# Patient Record
Sex: Female | Born: 1995 | Race: Black or African American | Hispanic: Yes | Marital: Single | Smoking: Never smoker
Health system: Southern US, Community
[De-identification: ages and names within clinical notes are randomized; demographics above are authoritative.]

## PROBLEM LIST (undated history)

## (undated) DIAGNOSIS — A749 Chlamydial infection, unspecified: Secondary | ICD-10-CM

## (undated) HISTORY — PX: WISDOM TOOTH EXTRACTION: SHX21

---

## 2017-01-09 ENCOUNTER — Encounter (HOSPITAL_COMMUNITY): Payer: Self-pay | Admitting: Emergency Medicine

## 2017-01-09 ENCOUNTER — Ambulatory Visit (HOSPITAL_COMMUNITY)
Admission: EM | Admit: 2017-01-09 | Discharge: 2017-01-09 | Disposition: A | Discharge status: Home / Self Care | Payer: PRIVATE HEALTH INSURANCE | Attending: Family Medicine | Admitting: Family Medicine

## 2017-01-09 DIAGNOSIS — N939 Abnormal uterine and vaginal bleeding, unspecified: Secondary | ICD-10-CM

## 2017-01-09 DIAGNOSIS — Z3202 Encounter for pregnancy test, result negative: Secondary | ICD-10-CM

## 2017-01-09 DIAGNOSIS — N926 Irregular menstruation, unspecified: Secondary | ICD-10-CM

## 2017-01-09 LAB — POCT URINALYSIS DIP (DEVICE)
Bilirubin Urine: NEGATIVE
GLUCOSE, UA: NEGATIVE mg/dL
HGB URINE DIPSTICK: NEGATIVE
Leukocytes, UA: NEGATIVE
Nitrite: NEGATIVE
PH: 7 (ref 5.0–8.0)
PROTEIN: NEGATIVE mg/dL
SPECIFIC GRAVITY, URINE: 1.02 (ref 1.005–1.030)
Urobilinogen, UA: 1 mg/dL (ref 0.0–1.0)

## 2017-01-09 NOTE — Discharge Instructions (Signed)
You have evidence of vaginal bleeding, that is most likely from the uterus but there was no definite source located on exam. You will need an ultrasound and OBGYN follow up. Please call the women's clinic as below to schedule follow up. If you continue to have bleeding, seek medical attention.   The urine pregnancy test was negative. Make sure to use condoms 100% of the time to avoid pregnancy.

## 2017-01-09 NOTE — ED Triage Notes (Signed)
Pt reports she had a normal period 4 weeks ago. She had sex three weeks ago. 10 days after her period, she bled again for a week. PT began vaginal bleeding again today. PT denies other discharge. PT reports left hip pain.

## 2017-01-09 NOTE — ED Provider Notes (Signed)
[facility]    CSN: [id number] Arrival date & time: 01/09/17  1926     History   Chief Complaint Chief Complaint  Patient presents with  . Vaginal Bleeding    HPI Cheryl Conner is a 21 y.o. female who presents for an episode of vaginal bleeding today.   She reports recently abnormal periods, occurring July 25, then again on Aug 9. Both lasted the regular 5 days, starting with heavy flow, becoming lighter. Previous to that she had regular monthly periods for the past year. She is not on contraception due to having irregular periods in the past on them. She has been sexually active with a single female partner with incomplete use of condoms. She has never been pregnant and does not desire pregnancy. About 1 hour prior to presenting in urgent care she wiped red after urinating, stating it was from the vagina. She denies recent sex. She also had a UTI (urinary frequency, dysuria) diagnosed by an OTC testing kit which resolved on its own several weeks ago. She took AZO OTC for this. She denies abdominal pain, N/V/D, fevers, vaginal discharge, and current urinary symptoms.    HPI  History reviewed. No pertinent past medical history.  There are no active problems to display for this patient.   History reviewed. No pertinent surgical history.  OB History    No data available       Home Medications    Prior to Admission medications   Not on File    Family History No family history on file.  Social History Social History  Substance Use Topics  . Smoking status: Never Smoker  . Smokeless tobacco: Never Used  . Alcohol use No     Allergies   Patient has no known allergies.   Review of Systems Review of Systems As above.   Physical Exam Triage Vital Signs ED Triage Vitals [01/09/17 1959]  Enc Vitals Group     BP 118/77     Pulse Rate 60     Resp 16     Temp 98.3 F (36.8 C)     Temp Source Oral     SpO2 100 %     Weight 150 lb (68 kg)     Height  _0  (1.6 m)     Head Circumference      Peak Flow      Pain Score 0     Pain Loc      Pain Edu?      Excl. in [place]?    No data found.  Physical Exam BP 118/77   Pulse 60   Temp 98.3 F (36.8 C) (Oral)   Resp 16   Ht _1  (1.6 m)   Wt 150 lb (68 kg)   LMP 12/28/2016   SpO2 100%   BMI 26.57 kg/m  Gen: Well-appearing 21 y.o.female in NAD  Pelvic: Normal external female genitalia without lesions. Vaginal mucosa and cervix normal without lesions. No discharge. There was very scant red blood in the vaginal vault without source from vaginal wall or from cervix with valsalva. No cervical motion tenderness.   Joelene Millin, RN was present throughout duration of exam.    UC Treatments / Results  Labs (all labs ordered are listed, but only abnormal results are displayed) Labs Reviewed  POCT URINALYSIS DIP (DEVICE) - Abnormal; Notable for the following:       Result Value   Ketones, ur TRACE (*)    All other components within  normal limits    EKG  EKG Interpretation None       Radiology No results found.  Procedures Procedures (including critical care time)  Medications Ordered in UC Medications - No data to display   Initial Impression / Assessment and Plan / UC Course  I have reviewed the triage vital signs and the nursing notes.  Pertinent labs & imaging results that were available during my care of the patient were reviewed by me and considered in my medical decision making (see chart for details).   Final Clinical Impressions(s) / UC Diagnoses   Final diagnoses:  Vaginal bleeding   21 y.o. female presenting for vaginal bleeding. Stable vital signs, nontoxic-appearing. Exam consistent with scant vaginal bleeding without definite source. UPT negative, UA negative.  - Refer to OB/GYN for further evaluation - Declines megace, provera, OCPs, etc.     Patrecia Pour, MD 01/09/17 2032

## 2017-01-10 ENCOUNTER — Telehealth: Payer: Self-pay | Admitting: Obstetrics and Gynecology

## 2017-01-10 NOTE — Telephone Encounter (Signed)
Patient called to make an appointment. When I gave her a date of 09/12, she said never mind then hung up the phone.

## 2017-01-11 ENCOUNTER — Encounter (HOSPITAL_COMMUNITY): Payer: Self-pay

## 2017-01-11 ENCOUNTER — Emergency Department (HOSPITAL_COMMUNITY)
Admission: EM | Admit: 2017-01-11 | Discharge: 2017-01-11 | Disposition: A | Discharge status: Home / Self Care | Payer: BLUE CROSS/BLUE SHIELD | Attending: Emergency Medicine | Admitting: Emergency Medicine

## 2017-01-11 DIAGNOSIS — B9689 Other specified bacterial agents as the cause of diseases classified elsewhere: Secondary | ICD-10-CM | POA: Insufficient documentation

## 2017-01-11 DIAGNOSIS — N939 Abnormal uterine and vaginal bleeding, unspecified: Secondary | ICD-10-CM | POA: Diagnosis not present

## 2017-01-11 DIAGNOSIS — N760 Acute vaginitis: Secondary | ICD-10-CM | POA: Insufficient documentation

## 2017-01-11 DIAGNOSIS — R1030 Lower abdominal pain, unspecified: Secondary | ICD-10-CM | POA: Diagnosis present

## 2017-01-11 LAB — CBC WITH DIFFERENTIAL/PLATELET
BASOS ABS: 0 10*3/uL (ref 0.0–0.1)
Basophils Relative: 0 %
EOS ABS: 0.1 10*3/uL (ref 0.0–0.7)
EOS PCT: 1 %
HCT: 36 % (ref 36.0–46.0)
Hemoglobin: 11.9 g/dL — ABNORMAL LOW (ref 12.0–15.0)
LYMPHS PCT: 49 %
Lymphs Abs: 2.5 10*3/uL (ref 0.7–4.0)
MCH: 28.5 pg (ref 26.0–34.0)
MCHC: 33.1 g/dL (ref 30.0–36.0)
MCV: 86.3 fL (ref 78.0–100.0)
MONO ABS: 0.5 10*3/uL (ref 0.1–1.0)
Monocytes Relative: 9 %
Neutro Abs: 2.1 10*3/uL (ref 1.7–7.7)
Neutrophils Relative %: 41 %
PLATELETS: 210 10*3/uL (ref 150–400)
RBC: 4.17 MIL/uL (ref 3.87–5.11)
RDW: 14.1 % (ref 11.5–15.5)
WBC: 5.2 10*3/uL (ref 4.0–10.5)

## 2017-01-11 LAB — PREGNANCY, URINE: Preg Test, Ur: NEGATIVE

## 2017-01-11 LAB — URINALYSIS, ROUTINE W REFLEX MICROSCOPIC
BILIRUBIN URINE: NEGATIVE
Bacteria, UA: NONE SEEN
GLUCOSE, UA: NEGATIVE mg/dL
KETONES UR: NEGATIVE mg/dL
LEUKOCYTES UA: NEGATIVE
NITRITE: NEGATIVE
PH: 6 (ref 5.0–8.0)
Protein, ur: NEGATIVE mg/dL
SPECIFIC GRAVITY, URINE: 1.019 (ref 1.005–1.030)

## 2017-01-11 LAB — COMPREHENSIVE METABOLIC PANEL
ALBUMIN: 3.9 g/dL (ref 3.5–5.0)
ALK PHOS: 41 U/L (ref 38–126)
ALT: 14 U/L (ref 14–54)
AST: 19 U/L (ref 15–41)
Anion gap: 5 (ref 5–15)
BILIRUBIN TOTAL: 0.3 mg/dL (ref 0.3–1.2)
BUN: 7 mg/dL (ref 6–20)
CALCIUM: 8.8 mg/dL — AB (ref 8.9–10.3)
CO2: 28 mmol/L (ref 22–32)
CREATININE: 0.8 mg/dL (ref 0.44–1.00)
Chloride: 108 mmol/L (ref 101–111)
GFR calc Af Amer: >60 mL/min (ref >60)
GFR calc non Af Amer: >60 mL/min (ref >60)
Glucose, Bld: 93 mg/dL (ref 65–99)
POTASSIUM: 3.8 mmol/L (ref 3.5–5.1)
Sodium: 141 mmol/L (ref 135–145)
TOTAL PROTEIN: 6.7 g/dL (ref 6.5–8.1)

## 2017-01-11 LAB — WET PREP, GENITAL
Sperm: NONE SEEN
Trich, Wet Prep: NONE SEEN
YEAST WET PREP: NONE SEEN

## 2017-01-11 MED ORDER — METRONIDAZOLE 500 MG PO TABS: 500.0000 mg | ORAL_TABLET | Freq: Two times a day (BID) | ORAL | 0 refills | Status: DC

## 2017-01-11 NOTE — ED Provider Notes (Signed)
[facility] Provider Note   CSN: [id number] Arrival date & time: 01/11/17  1240     History   Chief Complaint Chief Complaint  Patient presents with  . Vaginal Bleeding    HPI Cheryl Conner is a 21 y.o. female.  HPI  Patient presents to the for evaluation of abnormal vaginal bleeding and lower abdominal cramping. She states that about a month ago she took a Plan B pill. Since then she has had 3 menstrual cycles. She reports bleeding starts out like it only while she wipes but she has since had to use 2 pads every hour. She also reports some associated vaginal discharge. She is sexually active and states that the last time she had unprotected sexual intercourse was 6 days ago. She denies any birth control use, lightheadedness, dizziness, loss of consciousness,urinary symptoms, nausea or vomiting.  History reviewed. No pertinent past medical history.  There are no active problems to display for this patient.   History reviewed. No pertinent surgical history.  OB History    No data available       Home Medications    Prior to Admission medications   Medication Sig Start Date End Date Taking? Authorizing Provider  metroNIDAZOLE (FLAGYL) 500 MG tablet Take 1 tablet (500 mg total) by mouth 2 (two) times daily. 01/11/17   Dietrich Pates, PA-C    Family History History reviewed. No pertinent family history.  Social History Social History  Substance Use Topics  . Smoking status: Never Smoker  . Smokeless tobacco: Never Used  . Alcohol use No     Allergies   Patient has no known allergies.   Review of Systems Review of Systems  Constitutional: Negative for appetite change, chills and fever.  HENT: Negative for ear pain, rhinorrhea, sneezing and sore throat.   Eyes: Negative for photophobia and visual disturbance.  Respiratory: Negative for cough, chest tightness, shortness of breath and wheezing.   Cardiovascular: Negative for chest pain and palpitations.    Gastrointestinal: Positive for abdominal pain. Negative for blood in stool, constipation, diarrhea, nausea and vomiting.  Genitourinary: Positive for hematuria, vaginal bleeding and vaginal discharge. Negative for dysuria and urgency.  Musculoskeletal: Negative for myalgias.  Skin: Negative for rash.  Neurological: Negative for dizziness, weakness and light-headedness.     Physical Exam Updated Vital Signs BP (!) 104/53   Pulse 62   Temp 98 F (36.7 C) (Oral)   Resp 18   Ht 5\' 3"  (1.6 m)   Wt 68 kg (150 lb)   LMP 12/28/2016   SpO2 100%   BMI 26.57 kg/m   Physical Exam  Constitutional: She appears well-developed and well-nourished. No distress.  HENT:  Head: Normocephalic and atraumatic.  Nose: Nose normal.  Eyes: Conjunctivae and EOM are normal. Left eye exhibits no discharge. No scleral icterus.  Neck: Normal range of motion. Neck supple.  Cardiovascular: Normal rate, regular rhythm, normal heart sounds and intact distal pulses.  Exam reveals no gallop and no friction rub.   No murmur heard. Pulmonary/Chest: Effort normal and breath sounds normal. No respiratory distress.  Abdominal: Soft. Bowel sounds are normal. She exhibits no distension. There is tenderness (suprapubic and lower abdominal). There is no guarding.  Genitourinary: Cervix exhibits no motion tenderness and no discharge. Right adnexum displays no tenderness. Left adnexum displays no tenderness. There is bleeding in the vagina.  Genitourinary Comments: Chaperone present for the entirety of pelvic exam. Pelvic exam: normal external genitalia without evidence of trauma. VULVA: normal appearing  vulva with no masses, tenderness or lesion. VAGINA: dark red blood noted in vaginal vault.normal appearing vagina with normal color and discharge, no lesions. CERVIX: normal appearing cervix without lesions, cervical motion tenderness absent, cervical os closed with out purulent discharge;  No vaginal discharge. Wet prep  and DNA probe for chlamydia and GC obtained.   ADNEXA: normal adnexa in size, nontender and no masses UTERUS: uterus is normal size, shape, consistency and nontender.    Musculoskeletal: Normal range of motion. She exhibits no edema.  Neurological: She is alert. She exhibits normal muscle tone. Coordination normal.  Skin: Skin is warm and dry. No rash noted.  Psychiatric: She has a normal mood and affect.  Nursing note and vitals reviewed.    ED Treatments / Results  Labs (all labs ordered are listed, but only abnormal results are displayed) Labs Reviewed  WET PREP, GENITAL - Abnormal; Notable for the following:       Result Value   Clue Cells Wet Prep HPF POC PRESENT (*)    WBC, Wet Prep HPF POC FEW (*)    All other components within normal limits  URINALYSIS, ROUTINE W REFLEX MICROSCOPIC - Abnormal; Notable for the following:    Hgb urine dipstick MODERATE (*)    Squamous Epithelial / LPF 0-5 (*)    All other components within normal limits  COMPREHENSIVE METABOLIC PANEL - Abnormal; Notable for the following:    Calcium 8.8 (*)    All other components within normal limits  CBC WITH DIFFERENTIAL/PLATELET - Abnormal; Notable for the following:    Hemoglobin 11.9 (*)    All other components within normal limits  PREGNANCY, URINE  HIV ANTIBODY (ROUTINE TESTING)  RPR  GC/CHLAMYDIA PROBE AMP ([place]) NOT AT [facility]    EKG  EKG Interpretation None       Radiology No results found.  Procedures Procedures (including critical care time)  Medications Ordered in ED Medications - No data to display   Initial Impression / Assessment and Plan / ED Course  I have reviewed the triage vital signs and the nursing notes.  Pertinent labs & imaging results that were available during my care of the patient were reviewed by me and considered in my medical decision making (see chart for details).     Patient presents to ED for evaluation of abnormal vaginal bleeding and lower  abdominal cramping. She states that she took a Plan B pill about a month ago and since then she has had 3 menstrual cycles. She also reports vaginal discharge. She was seen at urgent care 2 days ago and was told she had negative pelvic exam. She was told to follow-up with women's clinic but was told to come here if pain got worse. Here she has some tenderness to palpation in the suprapubic area but no rebound or guarding present. She is afebrile with no history of fever. Pelvic exam reveals a closed cervix with some dark red blood in the vaginal vault. She has no cervical motion tenderness or adnexal tenderness. Pregnancy test was negative. Urinalysis with no evidence of UTI. CMP unremarkable. CBC with normal hemoglobin and hematocrit. Wet prep showed clue cells. GC and Chlamydia sent. HIV and RPR pending. No concerning vaginal discharge found in the exam. I informed patient has symptoms could be due to her taking the Plan B pill but there are other possibilities such as malignancy, cysts or other structural abnormalities. I will give her Flagyl for BV and encouraged her to follow-up with  women's clinic for further evaluation and imaging as needed. Patient appears stable for discharge at this time. Strict return precautions given.  Final Clinical Impressions(s) / ED Diagnoses   Final diagnoses:  BV (bacterial vaginosis)  Vaginal bleeding    New Prescriptions Discharge Medication List as of 01/11/2017  9:27 PM    START taking these medications   Details  metroNIDAZOLE (FLAGYL) 500 MG tablet Take 1 tablet (500 mg total) by mouth 2 (two) times daily., Starting Thu 01/11/2017, Print         Idelle Leech, [place], PA-C 01/11/17 2256    Nira Conn, MD 01/12/17 [phone]

## 2017-01-11 NOTE — ED Notes (Signed)
Pt called for v/s recheck, no response from lobby 

## 2017-01-11 NOTE — Discharge Instructions (Signed)
Please read attached information regarding your condition. Take Flagyl twice daily for 1 week. Follow-up with women's clinic listed below for further evaluation in the next 1-2 days. Return to ED for worsening abdominal pain, increased bleeding, lightheadedness, loss of consciousness, trouble breathing.

## 2017-01-11 NOTE — ED Notes (Signed)
Pt found in lobby after previously thought to have left AMA.

## 2017-01-11 NOTE — ED Notes (Signed)
Pt A/Ox4, ambulatory. Verbalizes understanding of d/c instructions, prescriptions, and follow up care. All belongings with pt upon departure

## 2017-01-11 NOTE — ED Triage Notes (Signed)
Patient reports she had a normal period July 20. Patient reports having unprotected sex on July 28 and took Plan B on July 29. Patient reports experiencing cloudy urine, urinary urgency and frequency, the week after Plan B, but patient states she managed it with OTC medication and "it just went away." Patient reported starting her period again on Aug 9. Patient states 2 days ago, she started experiencing vaginal bleeding again. Patient reports "when I go to the bathroom, it just fills the toilet with blood." Patient was seen at [facility] 2 days ago for the same. Patient states "I just keep bleeding so I came here. Patient denies abdominal pain/cramping. Patient denies other vaginal discharge other than the bleeding. Patient denies dysuria. Patient denies fever.

## 2017-01-12 LAB — RPR: RPR Ser Ql: NONREACTIVE

## 2017-01-12 LAB — GC/CHLAMYDIA PROBE AMP (~~LOC~~) NOT AT ARMC
CHLAMYDIA, DNA PROBE: NEGATIVE
Neisseria Gonorrhea: NEGATIVE

## 2017-01-12 LAB — HIV ANTIBODY (ROUTINE TESTING W REFLEX): HIV Screen 4th Generation wRfx: NONREACTIVE

## 2018-04-20 ENCOUNTER — Ambulatory Visit (HOSPITAL_COMMUNITY)
Admission: EM | Admit: 2018-04-20 | Discharge: 2018-04-20 | Disposition: A | Discharge status: Home / Self Care | Payer: BLUE CROSS/BLUE SHIELD | Attending: Internal Medicine | Admitting: Internal Medicine

## 2018-04-20 ENCOUNTER — Encounter (HOSPITAL_COMMUNITY): Payer: Self-pay

## 2018-04-20 DIAGNOSIS — L03019 Cellulitis of unspecified finger: Secondary | ICD-10-CM

## 2018-04-20 MED ORDER — CEPHALEXIN 500 MG PO CAPS: ORAL_CAPSULE | ORAL | 0 refills | Status: DC

## 2018-04-20 NOTE — Discharge Instructions (Signed)
You may soak your finger for a couple of day and 15-20 min at a time in Ebson salts and warm water.

## 2018-04-20 NOTE — ED Triage Notes (Signed)
Pt presents with possible infected fingernail after smashing the finger into a wall and lifting the nail off the nailbed.

## 2018-04-20 NOTE — ED Provider Notes (Signed)
[facility]    CSN: [id number] Arrival date & time: 04/20/18  1355     History   Chief Complaint Chief Complaint  Patient presents with  . Fingernail Issue    Right Pinky    HPI Cheryl Conner is a 22 y.o. female.   Who hit her R small finger on the wall accidentally 2 days ago. She was wearing fake nails and when she checked noticed the tip of her nail had detached. So she removed the fake nail. Since then noticed little pain and redness below the nail bed. Denies fever, chills, diaphoreses or aches.      History reviewed. No pertinent past medical history.  There are no active problems to display for this patient.   History reviewed. No pertinent surgical history.  OB History   None      Home Medications    Prior to Admission medications   Medication Sig Start Date End Date Taking? Authorizing Provider  cephALEXin (KEFLEX) 500 MG capsule 2 bid x 7 days 04/20/18   Rodriguez-Southworth, Nettie ElmSylvia, PA-C    Family History History reviewed. No pertinent family history.  Social History Social History   Tobacco Use  . Smoking status: Never Smoker  . Smokeless tobacco: Never Used  Substance Use Topics  . Alcohol use: No  . Drug use: Yes    Types: Marijuana     Allergies   Patient has no known allergies.   Review of Systems Review of Systems  Constitutional: Negative for chills, diaphoresis and fever.  Gastrointestinal: Negative for nausea.  Musculoskeletal: Negative for arthralgias and joint swelling.  Skin: Positive for color change. Negative for rash and wound.       See HPI  Neurological: Negative for weakness and numbness.     Physical Exam Triage Vital Signs ED Triage Vitals  Enc Vitals Group     BP 04/20/18 1511 (!) 110/56     Pulse Rate 04/20/18 1511 78     Resp 04/20/18 1511 20     Temp 04/20/18 1511 97.8 F (36.6 C)     Temp Source 04/20/18 1511 Oral     SpO2 04/20/18 1511 100 %     Weight --      Height --      Head  Circumference --      Peak Flow --      Pain Score 04/20/18 1510 5     Pain Loc --      Pain Edu? --      Excl. in [place]? --    No data found.  Updated Vital Signs BP (!) 110/56 (BP Location: Right Arm)   Pulse 78   Temp 97.8 F (36.6 C) (Oral)   Resp 20   LMP 04/07/2018   SpO2 100%   Visual Acuity Right Eye Distance:   Left Eye Distance:   Bilateral Distance:    Right Eye Near:   Left Eye Near:    Bilateral Near:     Physical Exam  Constitutional: She is oriented to person, place, and time. She appears well-developed and well-nourished. No distress.  HENT:  Head: Normocephalic.  Right Ear: External ear normal.  Left Ear: External ear normal.  Nose: Nose normal.  Eyes: Conjunctivae are normal. No scleral icterus.  Neck: Neck supple.  Pulmonary/Chest: Effort normal.  Musculoskeletal: She exhibits tenderness. She exhibits no edema or deformity.  R distal small finger with normal ROM, no ecchymosis or joint swelling.   Neurological: She is alert  and oriented to person, place, and time. No sensory deficit.  Skin: Skin is warm and dry. Capillary refill takes less than 2 seconds. She is not diaphoretic.  There is mild swelling and erythema on the distal dorsal R small finger surrounding the nail which is trimmed and does not look detached. The erythema is a little warm. There are no ecchymosis or open wounds.   Psychiatric: She has a normal mood and affect. Her behavior is normal. Judgment and thought content normal.  Nursing note and vitals reviewed.    UC Treatments / Results  Labs (all labs ordered are listed, but only abnormal results are displayed) Labs Reviewed - No data to display  EKG None  Radiology No results found.  Procedures   Medications Ordered in UC Medications - No data to display  Initial Impression / Assessment and Plan / UC Course  I have reviewed the triage vital signs and the nursing notes. I placed her on Keflex as noted x 7 days and  advised to soak finger in Ebson salts   Final Clinical Impressions(s) / UC Diagnoses   Final diagnoses:  Cellulitis of little finger     Discharge Instructions     You may soak your finger for a couple of day and 15-20 min at a time in Ebson salts and warm water.     ED Prescriptions    Medication Sig Dispense Auth. Provider   cephALEXin (KEFLEX) 500 MG capsule 2 bid x 7 days 20 capsule Rodriguez-Southworth, Nettie Elm, PA-C     Controlled Substance Prescriptions [place] Controlled Substance Registry consulted?    Garey Ham, PA-C 04/20/18 1535

## 2018-07-19 ENCOUNTER — Ambulatory Visit (HOSPITAL_COMMUNITY)
Admission: EM | Admit: 2018-07-19 | Discharge: 2018-07-19 | Disposition: A | Discharge status: Home / Self Care | Payer: BLUE CROSS/BLUE SHIELD

## 2018-07-23 ENCOUNTER — Encounter (HOSPITAL_COMMUNITY): Payer: Self-pay | Admitting: Emergency Medicine

## 2018-07-23 ENCOUNTER — Ambulatory Visit (HOSPITAL_COMMUNITY)
Admission: EM | Admit: 2018-07-23 | Discharge: 2018-07-23 | Disposition: A | Discharge status: Home / Self Care | Payer: BLUE CROSS/BLUE SHIELD | Attending: Family Medicine | Admitting: Family Medicine

## 2018-07-23 DIAGNOSIS — N760 Acute vaginitis: Secondary | ICD-10-CM | POA: Diagnosis not present

## 2018-07-23 MED ORDER — METRONIDAZOLE 500 MG PO TABS: 500.0000 mg | ORAL_TABLET | Freq: Two times a day (BID) | ORAL | 0 refills | Status: AC

## 2018-07-23 NOTE — ED Triage Notes (Signed)
Pt states for the last few months shes had some skin tags around her vaginal area. Pt c/o vaginal discharge on and off x1 week.

## 2018-07-23 NOTE — ED Provider Notes (Signed)
[facility]    CSN: [id number] Arrival date & time: 07/23/18  1109     History   Chief Complaint Chief Complaint  Patient presents with  . Vaginal Discharge  . Skin Problem    HPI Cheryl Conner is a 23 y.o. female.   Patient is a 23 year old female the presents today with approximately 3 days of vaginal discharge.  Symptoms been constant and remain the same.  She describes the discharge as white in color and milky.  Denies any specific foul odor.  Denies any associated bowel pain, back pain, pelvic pain, dysuria, hematuria.  Denies any urinary symptoms.  She is currently sexually active with one partner sometimes unprotected.  Last menstrual period was 06/28/2018.  She is not currently on any birth control.  She is also here with concerns of skin tags in the perineum.  These have been present for the past couple months.  She noticed them more after she had the vaginal area wax.  They do not cause her any irritation, itching or pain.  Reports that her sister has similar skin tags in the same area.  No fevers, chills, night sweats, drainage from the area.  ROS per HPI      History reviewed. No pertinent past medical history.  There are no active problems to display for this patient.   History reviewed. No pertinent surgical history.  OB History   No obstetric history on file.      Home Medications    Prior to Admission medications   Medication Sig Start Date End Date Taking? Authorizing Provider  metroNIDAZOLE (FLAGYL) 500 MG tablet Take 1 tablet (500 mg total) by mouth 2 (two) times daily. 07/23/18   Janace Aris, NP    Family History Family History  Problem Relation Age of Onset  . Cancer Mother   . Hypertension Mother     Social History Social History   Tobacco Use  . Smoking status: Never Smoker  . Smokeless tobacco: Never Used  Substance Use Topics  . Alcohol use: No  . Drug use: Yes    Types: Marijuana     Allergies   Patient has no  known allergies.   Review of Systems Review of Systems   Physical Exam Triage Vital Signs ED Triage Vitals  Enc Vitals Group     BP 07/23/18 1205 112/68     Pulse Rate 07/23/18 1205 63     Resp 07/23/18 1205 14     Temp 07/23/18 1205 98.1 F (36.7 C)     Temp src --      SpO2 07/23/18 1205 100 %     Weight --      Height --      Head Circumference --      Peak Flow --      Pain Score 07/23/18 1206 0     Pain Loc --      Pain Edu? --      Excl. in GC? --    No data found.  Updated Vital Signs BP 112/68   Pulse 63   Temp 98.1 F (36.7 C)   Resp 14   LMP 06/28/2018   SpO2 100%   Visual Acuity Right Eye Distance:   Left Eye Distance:   Bilateral Distance:    Right Eye Near:   Left Eye Near:    Bilateral Near:     Physical Exam Vitals signs and nursing note reviewed.  Constitutional:  General: She is not in acute distress.    Appearance: Normal appearance. She is not ill-appearing, toxic-appearing or diaphoretic.  HENT:     Head: Normocephalic and atraumatic.     Nose: Nose normal.     Mouth/Throat:     Pharynx: Oropharynx is clear.  Eyes:     Conjunctiva/sclera: Conjunctivae normal.  Neck:     Musculoskeletal: Normal range of motion.  Pulmonary:     Effort: Pulmonary effort is normal.  Abdominal:     Palpations: Abdomen is soft.     Tenderness: There is no abdominal tenderness.  Genitourinary:    Vagina: Vaginal discharge present.     Comments: External vaginal area with what appear to be skin tags  in the perineum. Non painful to touch.  No swelling or erythema to the labia.  Small amount of white discharge.    Speculum  exam deferred. No abd pain, pelvic pain, back pain Musculoskeletal: Normal range of motion.  Skin:    General: Skin is warm and dry.     Findings: No rash.  Neurological:     Mental Status: She is alert.  Psychiatric:        Mood and Affect: Mood normal.      UC Treatments / Results  Labs (all labs ordered are  listed, but only abnormal results are displayed) Labs Reviewed  CERVICOVAGINAL ANCILLARY ONLY    EKG None  Radiology No results found.  Procedures Procedures (including critical care time)  Medications Ordered in UC Medications - No data to display  Initial Impression / Assessment and Plan / UC Course  I have reviewed the triage vital signs and the nursing notes.  Pertinent labs & imaging results that were available during my care of the patient were reviewed by me and considered in my medical decision making (see chart for details).     Vaginitis. Based on history and symptoms we will go ahead and treat for bacterial vaginosis Swab sent for testing.  Will call with any positive results. Contact given for OB/GYN for further management to receive annual Pap smear and cervical cancer screening  Skin tags  This is a benign problem This is not causing her any discomfort at this time Recommend that if it starts to bother her based on the area then she can follow-up with dermatologist for possible removal Pt understanding and agreed to plan.  Final Clinical Impressions(s) / UC Diagnoses   Final diagnoses:  Acute vaginitis     Discharge Instructions     We will go ahead and treat you for bacterial vaginosis today.  Sending your swab for testing. We will call with any positive results.  The skin tags are not worrisome  If they become painful you may want to have them removed. You can see a dermatologist for this if  needed I recommend that you start seeing a GYN and getting regular pap smear based on your family hx of cervical cancer.  Follow up as needed for continued or worsening symptoms      ED Prescriptions    Medication Sig Dispense Auth. Provider   metroNIDAZOLE (FLAGYL) 500 MG tablet Take 1 tablet (500 mg total) by mouth 2 (two) times daily. 14 tablet Dahlia Byes A, NP     Controlled Substance Prescriptions [place] Controlled Substance Registry consulted? Not  Applicable   Janace Aris, NP 07/23/18 1325

## 2018-07-23 NOTE — Discharge Instructions (Signed)
We will go ahead and treat you for bacterial vaginosis today.  Sending your swab for testing. We will call with any positive results.  The skin tags are not worrisome  If they become painful you may want to have them removed. You can see a dermatologist for this if  needed I recommend that you start seeing a GYN and getting regular pap smear based on your family hx of cervical cancer.  Follow up as needed for continued or worsening symptoms

## 2018-07-25 LAB — CERVICOVAGINAL ANCILLARY ONLY
Bacterial vaginitis: POSITIVE — AB
CHLAMYDIA, DNA PROBE: NEGATIVE
Candida vaginitis: NEGATIVE
Neisseria Gonorrhea: NEGATIVE
Trichomonas: NEGATIVE

## 2018-10-16 ENCOUNTER — Encounter (HOSPITAL_COMMUNITY): Payer: Self-pay

## 2018-10-16 ENCOUNTER — Inpatient Hospital Stay (HOSPITAL_COMMUNITY)
Admission: AD | Admit: 2018-10-16 | Discharge: 2018-10-16 | Disposition: A | Discharge status: Home / Self Care | Payer: BLUE CROSS/BLUE SHIELD | Attending: Obstetrics and Gynecology | Admitting: Obstetrics and Gynecology

## 2018-10-16 ENCOUNTER — Other Ambulatory Visit: Payer: Self-pay

## 2018-10-16 ENCOUNTER — Inpatient Hospital Stay (HOSPITAL_COMMUNITY): Payer: BLUE CROSS/BLUE SHIELD

## 2018-10-16 DIAGNOSIS — Z6790 Unspecified blood type, Rh positive: Secondary | ICD-10-CM

## 2018-10-16 DIAGNOSIS — B373 Candidiasis of vulva and vagina: Secondary | ICD-10-CM | POA: Diagnosis not present

## 2018-10-16 DIAGNOSIS — O98811 Other maternal infectious and parasitic diseases complicating pregnancy, first trimester: Secondary | ICD-10-CM | POA: Diagnosis not present

## 2018-10-16 DIAGNOSIS — B3731 Acute candidiasis of vulva and vagina: Secondary | ICD-10-CM

## 2018-10-16 DIAGNOSIS — O26899 Other specified pregnancy related conditions, unspecified trimester: Secondary | ICD-10-CM

## 2018-10-16 DIAGNOSIS — R109 Unspecified abdominal pain: Secondary | ICD-10-CM

## 2018-10-16 DIAGNOSIS — O26891 Other specified pregnancy related conditions, first trimester: Secondary | ICD-10-CM | POA: Diagnosis present

## 2018-10-16 DIAGNOSIS — Z3A11 11 weeks gestation of pregnancy: Secondary | ICD-10-CM | POA: Insufficient documentation

## 2018-10-16 HISTORY — DX: Chlamydial infection, unspecified: A74.9

## 2018-10-16 LAB — CBC
HCT: 38.1 % (ref 36.0–46.0)
Hemoglobin: 12.8 g/dL (ref 12.0–15.0)
MCH: 29.1 pg (ref 26.0–34.0)
MCHC: 33.6 g/dL (ref 30.0–36.0)
MCV: 86.6 fL (ref 80.0–100.0)
Platelets: 246 10*3/uL (ref 150–400)
RBC: 4.4 MIL/uL (ref 3.87–5.11)
RDW: 12.5 % (ref 11.5–15.5)
WBC: 9.2 10*3/uL (ref 4.0–10.5)
nRBC: 0 % (ref 0.0–0.2)

## 2018-10-16 LAB — COMPREHENSIVE METABOLIC PANEL
ALT: 17 U/L (ref 0–44)
AST: 20 U/L (ref 15–41)
Albumin: 3.8 g/dL (ref 3.5–5.0)
Alkaline Phosphatase: 42 U/L (ref 38–126)
Anion gap: 9 (ref 5–15)
BUN: 7 mg/dL (ref 6–20)
CO2: 23 mmol/L (ref 22–32)
Calcium: 9.4 mg/dL (ref 8.9–10.3)
Chloride: 104 mmol/L (ref 98–111)
Creatinine, Ser: 0.71 mg/dL (ref 0.44–1.00)
GFR calc Af Amer: >60 mL/min (ref >60)
GFR calc non Af Amer: >60 mL/min (ref >60)
Glucose, Bld: 105 mg/dL — ABNORMAL HIGH (ref 70–99)
Potassium: 4.5 mmol/L (ref 3.5–5.1)
Sodium: 136 mmol/L (ref 135–145)
Total Bilirubin: 0.5 mg/dL (ref 0.3–1.2)
Total Protein: 7.3 g/dL (ref 6.5–8.1)

## 2018-10-16 LAB — URINALYSIS, ROUTINE W REFLEX MICROSCOPIC
Bilirubin Urine: NEGATIVE
Glucose, UA: NEGATIVE mg/dL
Hgb urine dipstick: NEGATIVE
Ketones, ur: 20 mg/dL — AB
Leukocytes,Ua: NEGATIVE
Nitrite: NEGATIVE
Protein, ur: NEGATIVE mg/dL
Specific Gravity, Urine: 1.017 (ref 1.005–1.030)
pH: 6 (ref 5.0–8.0)

## 2018-10-16 LAB — WET PREP, GENITAL
Clue Cells Wet Prep HPF POC: NONE SEEN
Sperm: NONE SEEN
Trich, Wet Prep: NONE SEEN

## 2018-10-16 MED ORDER — TERCONAZOLE 0.4 % VA CREA: 1.0000 | TOPICAL_CREAM | Freq: Every day | VAGINAL | 0 refills | Status: AC

## 2018-10-16 NOTE — MAU Note (Signed)
Abdominal pain that has gotten worse this week. 07/29/18, LMP. Rates pain 5/10. No bleeding.

## 2018-10-16 NOTE — Discharge Instructions (Signed)
Abdominal Pain During Pregnancy ° °Abdominal pain is common during pregnancy, and has many possible causes. Some causes are more serious than others, and sometimes the cause is not known. Abdominal pain can be a sign that labor is starting. It can also be caused by normal growth and stretching of muscles and ligaments during pregnancy. Always tell your health care provider if you have any abdominal pain. °Follow these instructions at home: °· Do not have sex or put anything in your vagina until your pain goes away completely. °· Get plenty of rest until your pain improves. °· Drink enough fluid to keep your urine pale yellow. °· Take over-the-counter and prescription medicines only as told by your health care provider. °· Keep all follow-up visits as told by your health care provider. This is important. °Contact a health care provider if: °· Your pain continues or gets worse after resting. °· You have lower abdominal pain that: °? Comes and goes at regular intervals. °? Spreads to your back. °? Is similar to menstrual cramps. °· You have pain or burning when you urinate. °Get help right away if: °· You have a fever or chills. °· You have vaginal bleeding. °· You are leaking fluid from your vagina. °· You are passing tissue from your vagina. °· You have vomiting or diarrhea that lasts for more than 24 hours. °· Your baby is moving less than usual. °· You feel very weak or faint. °· You have shortness of breath. °· You develop severe pain in your upper abdomen. °Summary °· Abdominal pain is common during pregnancy, and has many possible causes. °· If you experience abdominal pain during pregnancy, tell your health care provider right away. °· Follow your health care provider's home care instructions and keep all follow-up visits as directed. °This information is not intended to replace advice given to you by your health care provider. Make sure you discuss any questions you have with your health care  provider. °Document Released: 05/08/2005 Document Revised: 08/10/2016 Document Reviewed: 08/10/2016 °Elsevier Interactive Patient Education © 2019 [employer] ° °

## 2018-10-16 NOTE — MAU Provider Note (Signed)
History     CSN: [id number]  Arrival date and time: 10/16/18 1612   First Provider Initiated Contact with Patient 10/16/18 1640      Chief Complaint  Patient presents with  . Abdominal Pain   Ms. Cheryl Conner is a 23 y.o. G1P0 at 3579w2d who presents to MAU for stomach pain.  Onset: 1 week ago Location: LUQ Duration: 1 week Character: at first felt "like regular cramping," now is more intense, and "more long lasting today" - upon clarification, pt reports it used to come and go, but today she felt "more of a pressure when I was on my feet," sharp Aggravating/Associated: none/"an empty feeling after peeing" Relieving: none Treatment: none Severity: 5/10  Pt denies VB, vaginal discharge/odor/itching. Pt denies N/V, abdominal pain, constipation, diarrhea, or urinary problems. Pt denies fever, chills, fatigue, sweating or changes in appetite. Pt denies SOB or chest pain. Pt denies dizziness, HA, light-headedness, weakness.  Problems this pregnancy include: has not yet been seen, first appt this Friday. Allergies? NKDA Current medications/supplements? PNVs Prenatal care provider? [facility]   OB History    Gravida  1   Para      Term      Preterm      AB      Living        SAB      TAB      Ectopic      Multiple      Live Births              Past Medical History:  Diagnosis Date  . Chlamydia     Past Surgical History:  Procedure Laterality Date  . WISDOM TOOTH EXTRACTION      Family History  Problem Relation Age of Onset  . Cancer Mother   . Hypertension Mother     Social History   Tobacco Use  . Smoking status: Never Smoker  . Smokeless tobacco: Never Used  Substance Use Topics  . Alcohol use: Not Currently  . Drug use: Yes    Types: Marijuana    Allergies: No Known Allergies  Medications Prior to Admission  Medication Sig Dispense Refill Last Dose  . metroNIDAZOLE (FLAGYL) 500 MG tablet Take 1 tablet (500 mg total) by mouth 2  (two) times daily. 14 tablet 0     Review of Systems  Constitutional: Negative for chills, diaphoresis, fatigue and fever.  Respiratory: Negative for shortness of breath.   Cardiovascular: Negative for chest pain.  Gastrointestinal: Positive for abdominal pain. Negative for constipation, diarrhea, nausea and vomiting.  Genitourinary: Negative for dysuria, flank pain, frequency, pelvic pain, urgency, vaginal bleeding and vaginal discharge.  Neurological: Negative for dizziness, weakness, light-headedness and headaches.   Physical Exam   Blood pressure 115/70, pulse 89, temperature 98.6 F (37 C), temperature source Oral, resp. rate 16, last menstrual period 07/29/2018, SpO2 100 %.  Patient Vitals for the past 24 hrs:  BP Temp Temp src Pulse Resp SpO2  10/16/18 1632 115/70 - - 89 - 100 %  10/16/18 1630 - 98.6 F (37 C) Oral - 16 -   Physical Exam  Constitutional: She is oriented to person, place, and time. She appears well-developed and well-nourished. No distress.  HENT:  Head: Normocephalic and atraumatic.  Respiratory: Effort normal.  GI: Soft. She exhibits no distension and no mass. There is no abdominal tenderness. There is no rebound and no guarding.  Genitourinary: There is no rash, tenderness or lesion on the right labia. There  is no rash, tenderness or lesion on the left labia. Uterus is not fixed and not tender. Cervix exhibits no motion tenderness, no discharge and no friability. Right adnexum displays no mass, no tenderness and no fullness. Left adnexum displays no mass, no tenderness and no fullness.    Vaginal discharge (normal, white) present.     No vaginal tenderness or bleeding.  No tenderness or bleeding in the vagina.  Neurological: She is alert and oriented to person, place, and time.  Skin: Skin is warm and dry. She is not diaphoretic.  Psychiatric: She has a normal mood and affect. Her behavior is normal. Judgment and thought content normal.   Results for  orders placed or performed during the hospital encounter of 10/16/18 (from the past 24 hour(s))  Urinalysis, Routine w reflex microscopic     Status: Abnormal   Collection Time: 10/16/18  4:28 PM  Result Value Ref Range   Color, Urine YELLOW YELLOW   APPearance HAZY (A) CLEAR   Specific Gravity, Urine 1.017 1.005 - 1.030   pH 6.0 5.0 - 8.0   Glucose, UA NEGATIVE NEGATIVE mg/dL   Hgb urine dipstick NEGATIVE NEGATIVE   Bilirubin Urine NEGATIVE NEGATIVE   Ketones, ur 20 (A) NEGATIVE mg/dL   Protein, ur NEGATIVE NEGATIVE mg/dL   Nitrite NEGATIVE NEGATIVE   Leukocytes,Ua NEGATIVE NEGATIVE  CBC     Status: None   Collection Time: 10/16/18  4:53 PM  Result Value Ref Range   WBC 9.2 4.0 - 10.5 K/uL   RBC 4.40 3.87 - 5.11 MIL/uL   Hemoglobin 12.8 12.0 - 15.0 g/dL   HCT 16.1 09.6 - 04.5 %   MCV 86.6 80.0 - 100.0 fL   MCH 29.1 26.0 - 34.0 pg   MCHC 33.6 30.0 - 36.0 g/dL   RDW 40.9 81.1 - 91.4 %   Platelets 246 150 - 400 K/uL   nRBC 0.0 0.0 - 0.2 %  Wet prep, genital     Status: Abnormal   Collection Time: 10/16/18  4:53 PM  Result Value Ref Range   Yeast Wet Prep HPF POC PRESENT (A) NONE SEEN   Trich, Wet Prep NONE SEEN NONE SEEN   Clue Cells Wet Prep HPF POC NONE SEEN NONE SEEN   WBC, Wet Prep HPF POC MANY (A) NONE SEEN   Sperm NONE SEEN   Comprehensive metabolic panel     Status: Abnormal   Collection Time: 10/16/18  4:53 PM  Result Value Ref Range   Sodium 136 135 - 145 mmol/L   Potassium 4.5 3.5 - 5.1 mmol/L   Chloride 104 98 - 111 mmol/L   CO2 23 22 - 32 mmol/L   Glucose, Bld 105 (H) 70 - 99 mg/dL   BUN 7 6 - 20 mg/dL   Creatinine, Ser 7.82 0.44 - 1.00 mg/dL   Calcium 9.4 8.9 - 95.6 mg/dL   Total Protein 7.3 6.5 - 8.1 g/dL   Albumin 3.8 3.5 - 5.0 g/dL   AST 20 15 - 41 U/L   ALT 17 0 - 44 U/L   Alkaline Phosphatase 42 38 - 126 U/L   Total Bilirubin 0.5 0.3 - 1.2 mg/dL   GFR calc non Af Amer >60 >60 mL/min   GFR calc Af Amer >60 >60 mL/min   Anion gap 9 5 - 15  ABO/Rh      Status: None   Collection Time: 10/16/18  4:55 PM  Result Value Ref Range   ABO/RH(D)  O POS Performed at [facility], [address], [place], [place] [address]    US Ob Comp Less 14 Wks  Result Date: 10/16/2018 CLINICAL DATA:  Pregnant patient with pelvic pain. EXAM: OBSTETRIC <14 WK ULTRASOUND TECHNIQUE: Transabdominal ultrasound was performed for evaluation of the gestation as well as the maternal uterus and adnexal regions. COMPARISON:  None. FINDINGS: Intrauterine gestational sac: Single. Yolk sac:  Visualized. Embryo:  Visualized. Cardiac Activity: Detected. Heart Rate: 157 bpm CRL:   49.7 mm   11 w 5 d                  [place] EDC: 05/02/2019. Subchorionic hemorrhage: Small to moderate subchorionic hemorrhage is identified. Maternal uterus/adnexae: No acute finding. Corpus luteum cyst on the left is noted. IMPRESSION: Single living intrauterine pregnancy with a small to moderate subchorionic hemorrhage identified. Electronically Signed   By: Drusilla Kanner M.D.   On: 10/16/2018 18:21    MAU Course  Procedures  MDM -LUQ pain per pt report, without evidence of pain on exam -UA: hazy/20Ketones, otherwise WNL -CBC: WNL -[place]: single IUP, FHB 157, [redacted]w[redacted]d, small-mod subchorionic hemorrhage, left CL -CMP: WNL -ABO: O Positive -WetPrep: +yeast, + WBCs, otherwise WNL -GC/CT collected -pt discharged to home in stable condition  Orders Placed This Encounter  Procedures  . Wet prep, genital    Standing Status:   Standing    Number of Occurrences:   1  . US OB Comp Less 14 Wks    Standing Status:   Standing    Number of Occurrences:   1    Order Specific Question:   Symptom/Reason for Exam    Answer:   Abdominal pain in pregnancy [191478]  . Urinalysis, Routine w reflex microscopic    Standing Status:   Standing    Number of Occurrences:   1  . CBC    Standing Status:   Standing    Number of Occurrences:   1  . Comprehensive metabolic panel    PLEASE DRAW ABO     Standing Status:   Standing    Number of Occurrences:   1  . ABO/Rh    Standing Status:   Standing    Number of Occurrences:   1  . Discharge patient    Order Specific Question:   Discharge disposition    Answer:   01-Home or Self Care [1]    Order Specific Question:   Discharge patient date    Answer:   10/16/2018   Meds ordered this encounter  Medications  . terconazole (TERAZOL 7) 0.4 % vaginal cream    Sig: Place 1 applicator vaginally at bedtime for 7 days.    Dispense:  45 g    Refill:  0    Order Specific Question:   Supervising Provider    Answer:   Roberts Bing [[id number]]   Assessment and Plan   1. Abdominal pain in pregnancy   2. Vaginal yeast infection   3. [redacted] weeks gestation of pregnancy   4. Blood type, Rh positive    Allergies as of 10/16/2018   No Known Allergies     Medication List    TAKE these medications   metroNIDAZOLE 500 MG tablet Commonly known as:  FLAGYL Take 1 tablet (500 mg total) by mouth 2 (two) times daily.   terconazole 0.4 % vaginal cream Commonly known as:  TERAZOL 7 Place 1 applicator vaginally at bedtime for 7 days.      -  will call with culture results, if positive -discussed normal aches and pains of pregnancy and nonpharmacologic treatment -strict abdominal pain/vaginal bleeding/return MAU precautions discussed -pt questions asked and answered -pt discharged to home in stable condition  Odie Sera Nugent 10/16/2018, 6:45 PM

## 2018-10-17 LAB — GC/CHLAMYDIA PROBE AMP (~~LOC~~) NOT AT ARMC
Chlamydia: NEGATIVE
Neisseria Gonorrhea: NEGATIVE

## 2018-10-17 LAB — ABO/RH: ABO/RH(D): O POS

## 2018-10-18 ENCOUNTER — Other Ambulatory Visit (HOSPITAL_COMMUNITY): Payer: Self-pay | Admitting: Family

## 2018-10-18 DIAGNOSIS — Z3682 Encounter for antenatal screening for nuchal translucency: Secondary | ICD-10-CM

## 2018-10-18 DIAGNOSIS — Z3A13 13 weeks gestation of pregnancy: Secondary | ICD-10-CM

## 2018-10-28 ENCOUNTER — Other Ambulatory Visit: Payer: PRIVATE HEALTH INSURANCE

## 2018-10-28 ENCOUNTER — Ambulatory Visit (HOSPITAL_COMMUNITY): Payer: BLUE CROSS/BLUE SHIELD | Admitting: *Deleted

## 2018-10-28 ENCOUNTER — Ambulatory Visit (HOSPITAL_COMMUNITY)
Admission: RE | Admit: 2018-10-28 | Discharge: 2018-10-28 | Disposition: A | Discharge status: Home / Self Care | Payer: BLUE CROSS/BLUE SHIELD | Source: Ambulatory Visit | Attending: Obstetrics and Gynecology | Admitting: Obstetrics and Gynecology

## 2018-10-28 ENCOUNTER — Other Ambulatory Visit: Payer: Self-pay

## 2018-10-28 ENCOUNTER — Ambulatory Visit (HOSPITAL_COMMUNITY): Payer: BLUE CROSS/BLUE SHIELD

## 2018-10-28 ENCOUNTER — Encounter (HOSPITAL_COMMUNITY): Payer: Self-pay

## 2018-10-28 VITALS — BP 109/61 | HR 71 | Temp 98.2°F | Wt 150.4 lb

## 2018-10-28 DIAGNOSIS — Z3682 Encounter for antenatal screening for nuchal translucency: Secondary | ICD-10-CM

## 2018-10-28 DIAGNOSIS — Z3A13 13 weeks gestation of pregnancy: Secondary | ICD-10-CM | POA: Diagnosis present

## 2020-08-26 IMAGING — US OBSTETRIC <14 WK ULTRASOUND
1 series · 15 of 28 positions shown · non-contrast
Comparison: None.

CLINICAL DATA: Pregnant patient with pelvic pain.

EXAM:
OBSTETRIC <14 WK ULTRASOUND
TECHNIQUE: Transabdominal ultrasound was performed for evaluation of the
gestation as well as the maternal uterus and adnexal regions.

[Series 1: obstetric <14 wk ultrasound · 15 of 38 slices shown]
[im 1/38]
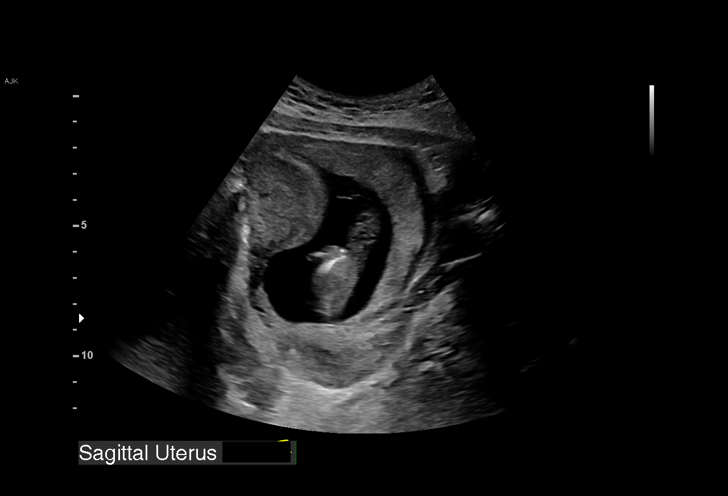
[im 3/38]
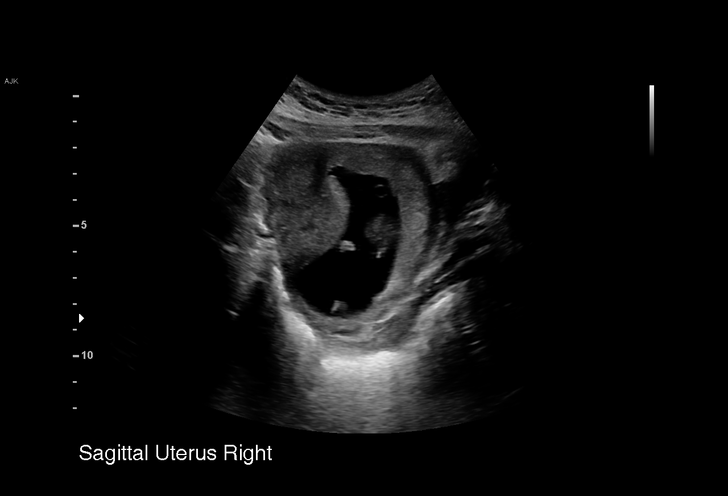
[im 6/38]
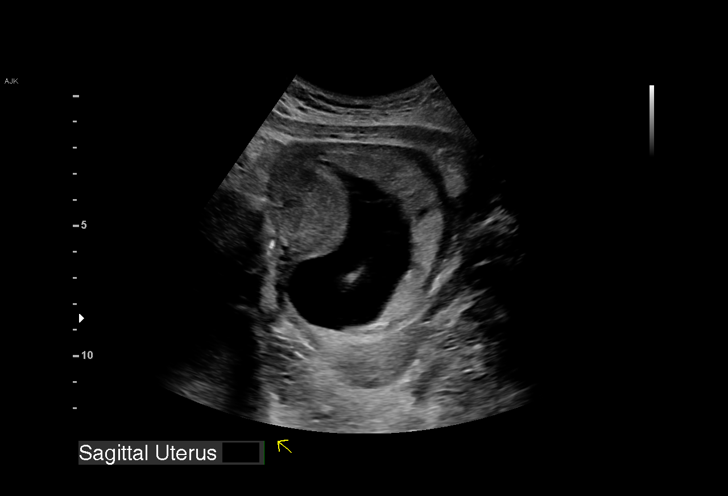
[im 9/38]
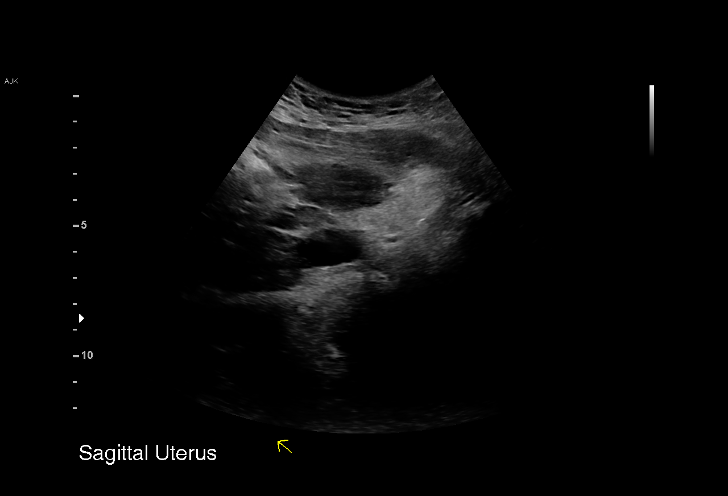
[im 11/38]
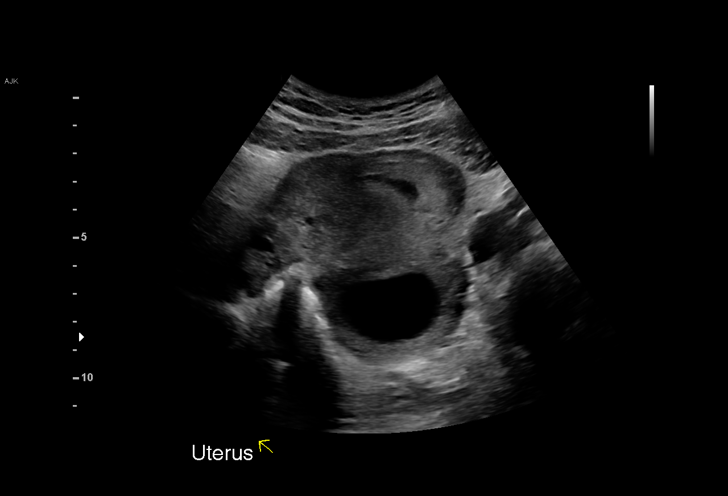
[im 14/38]
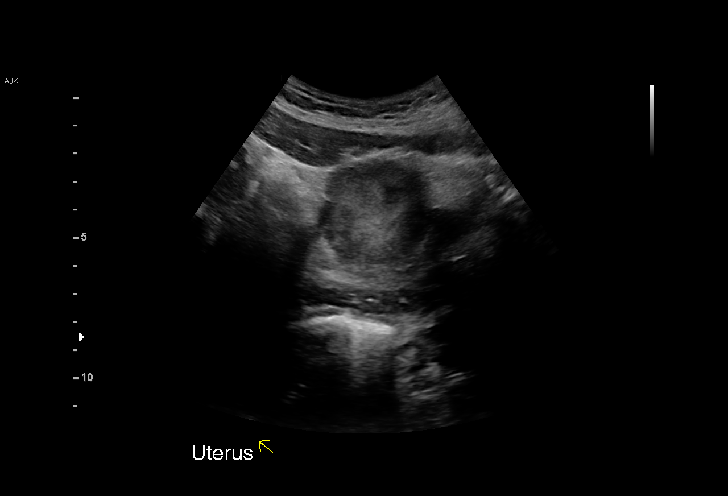
[im 17/38]
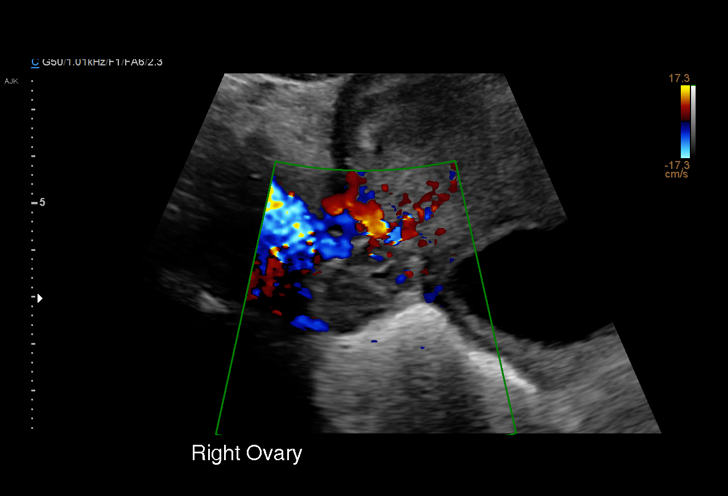
[im 20/38]
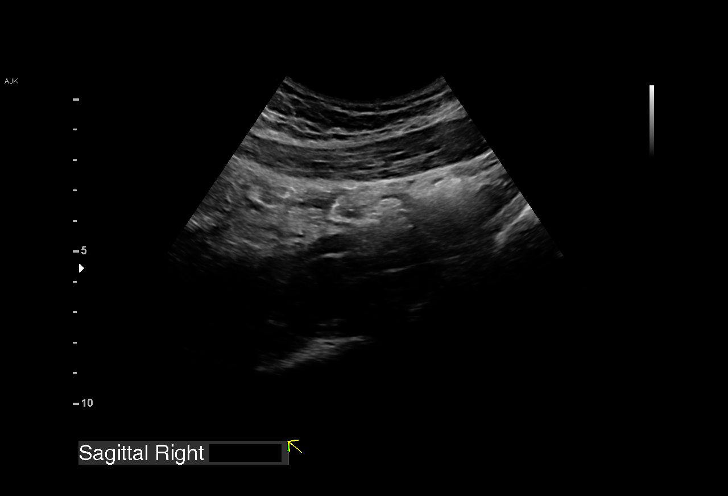
[im 21/38]
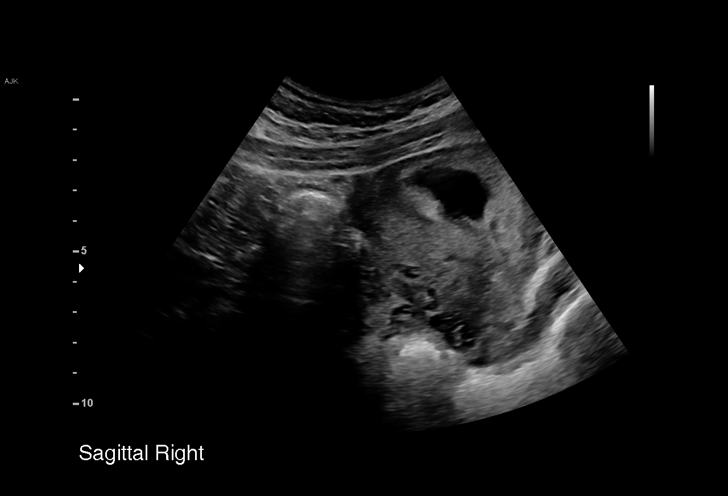
[im 24/38]
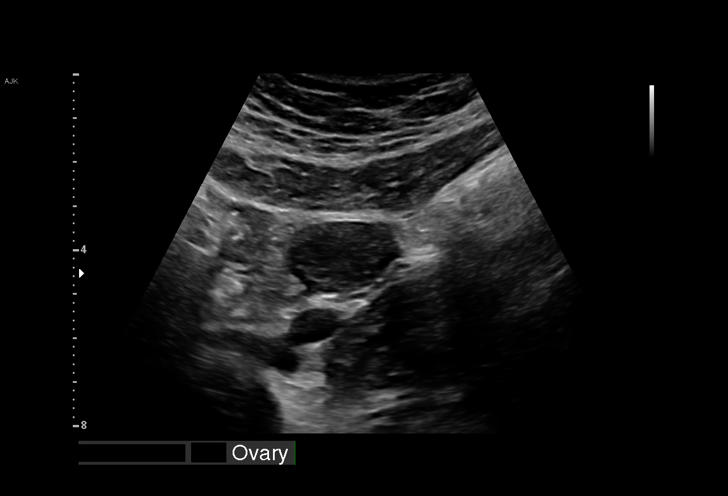
[im 27/38]
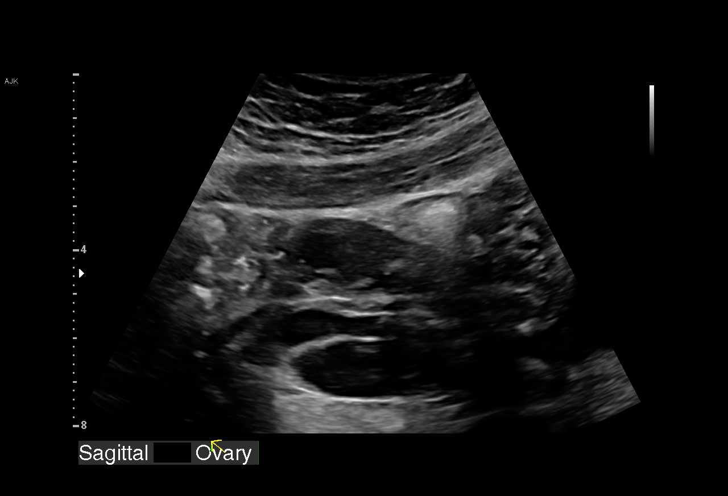
[im 29/38]
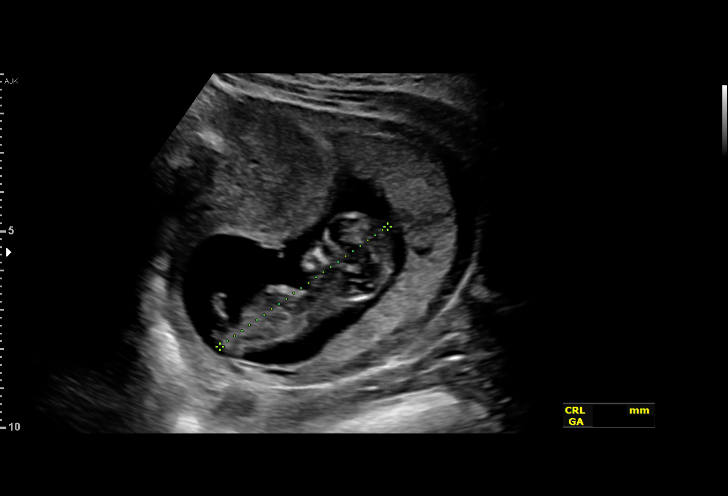
[im 32/38]
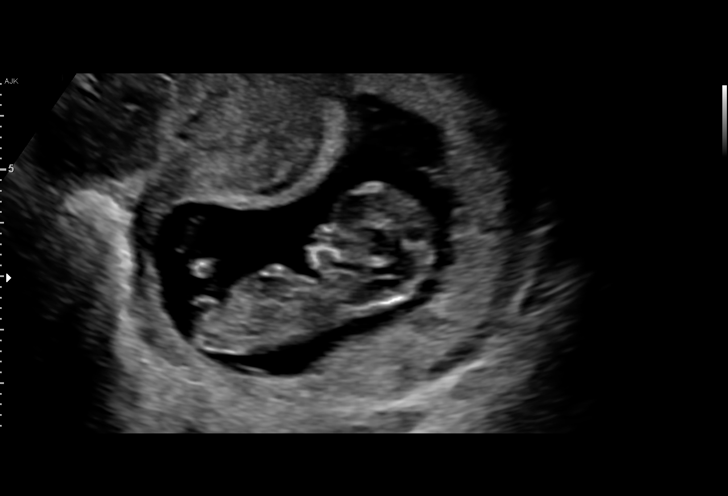
[im 35/38]
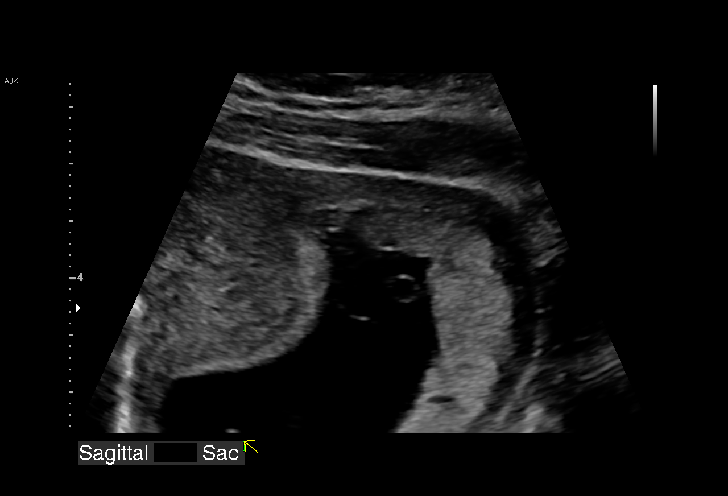
[im 38/38]
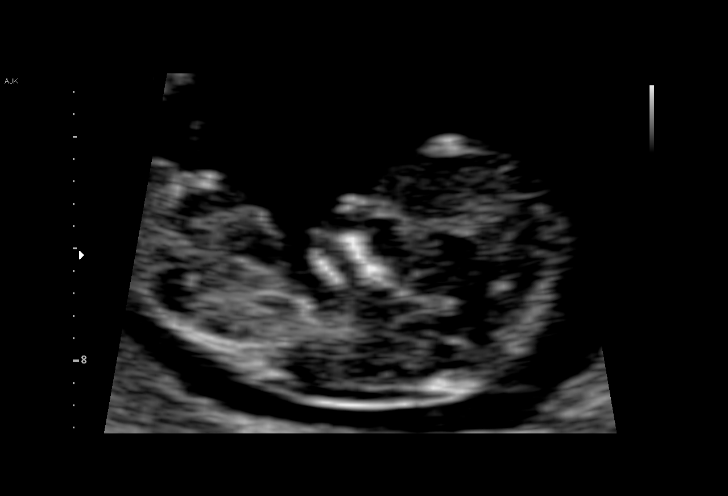

[15 of 28 positions shown; findings below may reference images not displayed]

FINDINGS: Intrauterine gestational sac: Single.

Yolk sac:  Visualized.

Embryo:  Visualized.

Cardiac Activity: Detected.

Heart Rate: 157 bpm

CRL:   49.7 mm   11 w 5 d                  US EDC: 05/02/2019.

Subchorionic hemorrhage: Small to moderate subchorionic hemorrhage
is identified.

Maternal uterus/adnexae: No acute finding. Corpus luteum cyst on the
left is noted.
IMPRESSION: Single living intrauterine pregnancy with a small to moderate
subchorionic hemorrhage identified.
